# Patient Record
Sex: Male | Born: 1980 | Hispanic: Yes | Marital: Single | Smoking: Never smoker
Health system: Southern US, Community
[De-identification: ages and names within clinical notes are randomized; demographics above are authoritative.]

---

## 2016-09-03 ENCOUNTER — Emergency Department
Admission: EM | Admit: 2016-09-03 | Discharge: 2016-09-03 | Disposition: A | Discharge status: Home / Self Care | Payer: BLUE CROSS/BLUE SHIELD | Attending: Emergency Medicine | Admitting: Emergency Medicine

## 2016-09-03 ENCOUNTER — Emergency Department: Payer: BLUE CROSS/BLUE SHIELD

## 2016-09-03 ENCOUNTER — Encounter: Payer: Self-pay | Admitting: Emergency Medicine

## 2016-09-03 DIAGNOSIS — R1032 Left lower quadrant pain: Secondary | ICD-10-CM

## 2016-09-03 DIAGNOSIS — I861 Scrotal varices: Secondary | ICD-10-CM

## 2016-09-03 DIAGNOSIS — N50812 Left testicular pain: Secondary | ICD-10-CM | POA: Diagnosis present

## 2016-09-03 LAB — URINALYSIS, COMPLETE (UACMP) WITH MICROSCOPIC
BACTERIA UA: NONE SEEN
BILIRUBIN URINE: NEGATIVE
Glucose, UA: NEGATIVE mg/dL
KETONES UR: NEGATIVE mg/dL
LEUKOCYTES UA: NEGATIVE
Nitrite: NEGATIVE
Protein, ur: NEGATIVE mg/dL
SPECIFIC GRAVITY, URINE: 1.02 (ref 1.005–1.030)
pH: 6 (ref 5.0–8.0)

## 2016-09-03 NOTE — ED Provider Notes (Signed)
[facility] Emergency Department Provider Note ____________________________________________   I have reviewed the triage vital signs and the triage nursing note.  HISTORY  Chief Complaint Groin Pain   Historian Patient, through Spanish interpreter  HPI Brandon Weeks is a 36 y.o. male presents today for evaluation of left testicular pain for about 2 days now. Somewhat acute onset, but initially about 4-10 pain and over the past 2 days is now 2 out of 10 pain. He thinks there is a little bit of swelling to the testicle. No nausea or vomiting. No flank pain. No abdominal pain. No hematuria. No frequency of urination.    History reviewed. No pertinent past medical history.  There are no active problems to display for this patient.   History reviewed. No pertinent surgical history.  Prior to Admission medications   Not on File    No Known Allergies  History reviewed. No pertinent family history.  Social History Social History  Substance Use Topics  . Smoking status: Never Smoker  . Smokeless tobacco: Never Used  . Alcohol use No    Review of Systems  Constitutional: Negative for fever. Eyes: Negative for visual changes. ENT: Negative for sore throat. Cardiovascular: Negative for chest pain. Respiratory: Negative for shortness of breath. Gastrointestinal: Negative for abdominal pain, vomiting and diarrhea. Genitourinary: Negative for dysuria. Musculoskeletal: Negative for back pain. Skin: Negative for rash. Neurological: Negative for headache. 10 point Review of Systems otherwise negative ____________________________________________   PHYSICAL EXAM:  VITAL SIGNS: ED Triage Vitals  Enc Vitals Group     BP 09/03/16 0959 135/84     Pulse Rate 09/03/16 0959 72     Resp 09/03/16 0959 18     Temp 09/03/16 0959 98.5 F (36.9 C)     Temp Source 09/03/16 0959 Oral     SpO2 09/03/16 0959 100 %     Weight 09/03/16 0953 145 lb (65.8 kg)      Height --      Head Circumference --      Peak Flow --      Pain Score 09/03/16 0953 2     Pain Loc --      Pain Edu? --      Excl. in GC? --      Constitutional: Alert and oriented. Well appearing and in no distress. HEENT   Head: Normocephalic and atraumatic.      Eyes: Conjunctivae are normal. PERRL. Normal extraocular movements.      Ears:         Nose: No congestion/rhinnorhea.   Mouth/Throat: Mucous membranes are moist.   Neck: No stridor. Cardiovascular/Chest: Normal rate, regular rhythm.  No murmurs, rubs, or gallops. Respiratory: Normal respiratory effort without tachypnea nor retractions. Breath sounds are clear and equal bilaterally. No wheezes/rales/rhonchi. Gastrointestinal: Soft. No distention, no guarding, no rebound. Nontender.  Genitourinary/rectal:  No cellulitis of the scrotum. Very minimally tender slight enlargement at the anterior pole of the testicle. No swelling of the testicle itself. No hernia mass. Musculoskeletal: Nontender with normal range of motion in all extremities. No joint effusions.  No lower extremity tenderness.  No edema. Neurologic:  Normal speech and language. No gross or focal neurologic deficits are appreciated. Skin:  Skin is warm, dry and intact. No rash noted. Psychiatric: Mood and affect are normal. Speech and behavior are normal. Patient exhibits appropriate insight and judgment.   ____________________________________________  LABS (pertinent positives/negatives)  Labs Reviewed  URINALYSIS, COMPLETE (UACMP) WITH MICROSCOPIC - Abnormal; Notable for  the following:       Result Value   Color, Urine YELLOW (*)    APPearance CLEAR (*)    Hgb urine dipstick SMALL (*)    Squamous Epithelial / LPF 0-5 (*)    All other components within normal limits    ____________________________________________    EKG I, Governor Rooksebecca Shauntavia Brackin, MD, the attending physician have personally viewed and interpreted all  ECGs.  None ____________________________________________  RADIOLOGY All Xrays were viewed by me. Imaging interpreted by Radiologist.  Ultrasound scrotum with Doppler:  IMPRESSION: 1. No evidence of testicular torsion. 2. Left-sided varicocele. __________________________________________  PROCEDURES  Procedure(s) performed: None  Critical Care performed: None  ____________________________________________   ED COURSE / ASSESSMENT AND PLAN  Pertinent labs & imaging results that were available during my care of the patient were reviewed by me and considered in my medical decision making (see chart for details).    Brandon Weeks is here for evaluation of left groin pain which is improving and essentially mild at this point in time. External physical exam evaluation is overall reassuring. I have a low suspicion for testicular torsion.  Low suspicion for kidney stone, intra-abdominal emergency, inguinal hernia on physical exam. Urinalysis is overall reassuring. I will send a culture.  Ultrasound is reassuring for no sign of torsion. There is a varicocele.  Discussed anti-inflammatory symptomatic treatment and monitoring, follow-up with primary care doctor and/or urologist if there is any persistent discomfort.    CONSULTATIONS:  None Patient / Family / Caregiver informed of clinical course, medical decision-making process, and agree with plan.   I discussed return precautions, follow-up instructions, and discharge instructions with patient and/or family.  We discussed return to the emergency department for any worsening pain, bloody urine, abdominal pain, back pain, vomiting, black or bloody stools, inability to urinate, or any other symptoms concerning to him.  ___________________________________________   FINAL CLINICAL IMPRESSION(S) / ED DIAGNOSES   Final diagnoses:  Left groin pain  Varicocele              Note: This dictation was prepared with Dragon  dictation. Any transcriptional errors that result from this process are unintentional    Governor Rooksebecca Otillia Cordone, MD 09/03/16 1212

## 2016-09-03 NOTE — ED Notes (Signed)
[facility] [place] Interpretor will be needed for discharge. Urine sent.

## 2016-09-03 NOTE — ED Triage Notes (Signed)
Pt to ed with c/o left sided groin pain that  Started on Thursday. Denies injury, worse pain yesterday,

## 2018-05-31 IMAGING — US US ART/VEN ABD/PELV/SCROTUM DOPPLER LTD
1 series · 14 of 25 positions shown · non-contrast
Comparison: None.

CLINICAL DATA: Left groin pain.

EXAM:
SCROTAL ULTRASOUND
DOPPLER ULTRASOUND OF THE TESTICLES
TECHNIQUE: Complete ultrasound examination of the testicles, epididymis, and
other scrotal structures was performed. Color and spectral Doppler
ultrasound were also utilized to evaluate blood flow to the
testicles.

[Series 1: us art/ven abd/pelv/scrotum doppler ltd · 0.08mm/px · 14 of 93 slices shown]
[im 1/93]
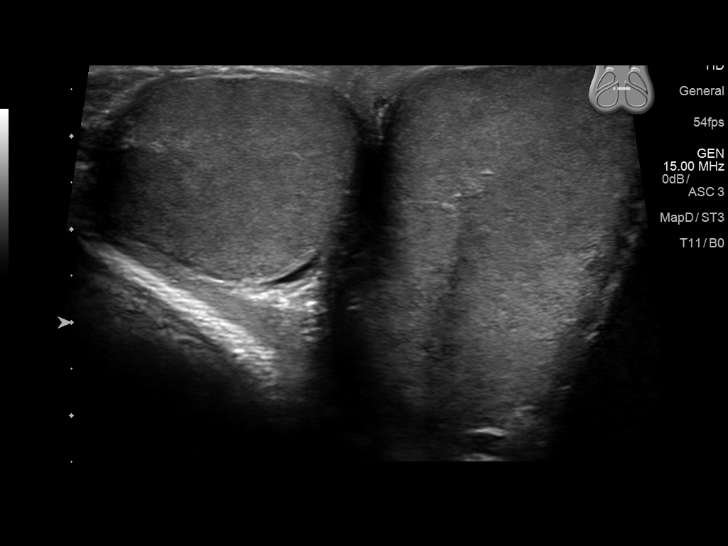
[im 8/93]
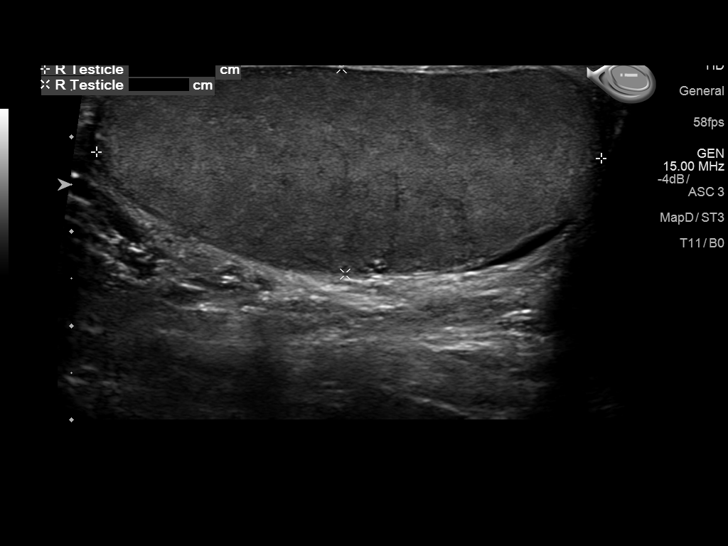
[im 16/93]
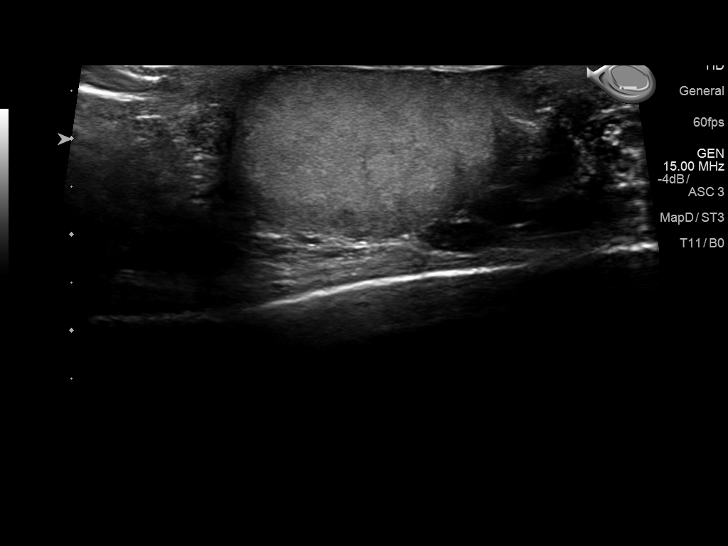
[im 24/93]
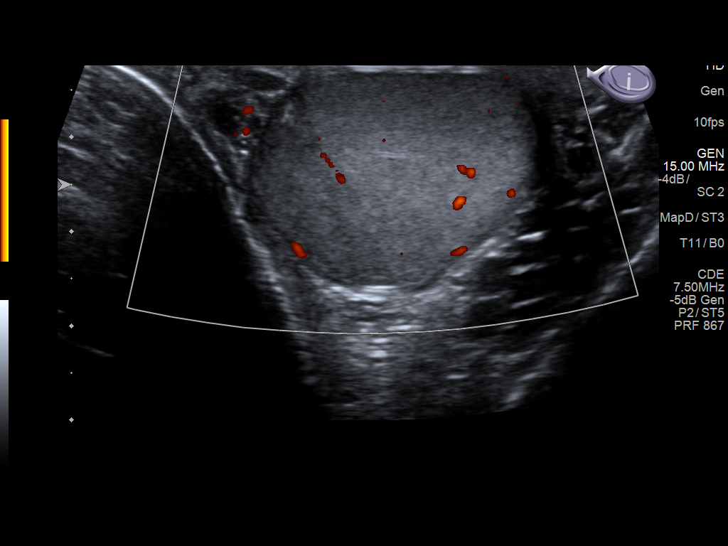
[im 31/93]
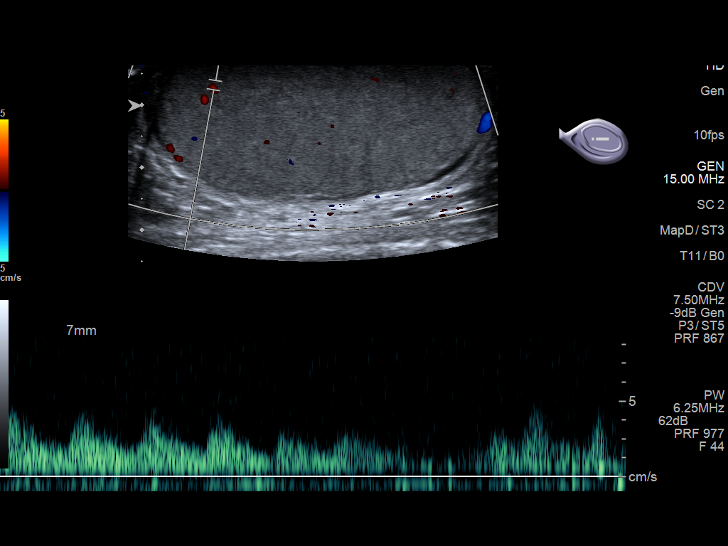
[im 35/93]
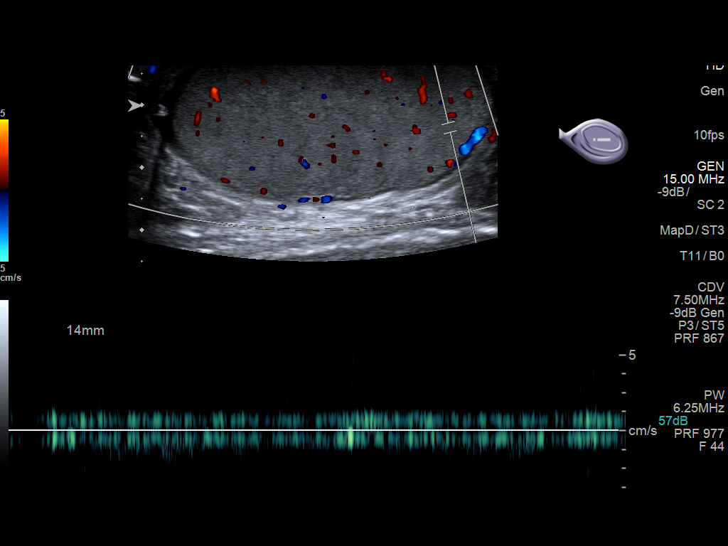
[im 43/93]
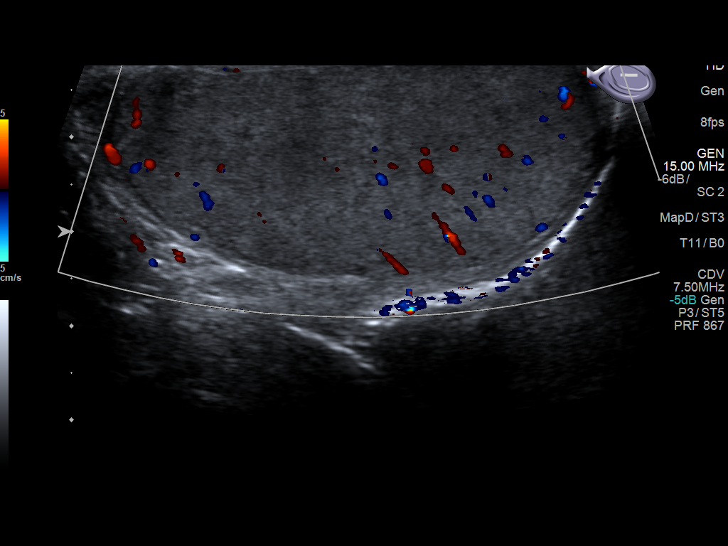
[im 50/93]
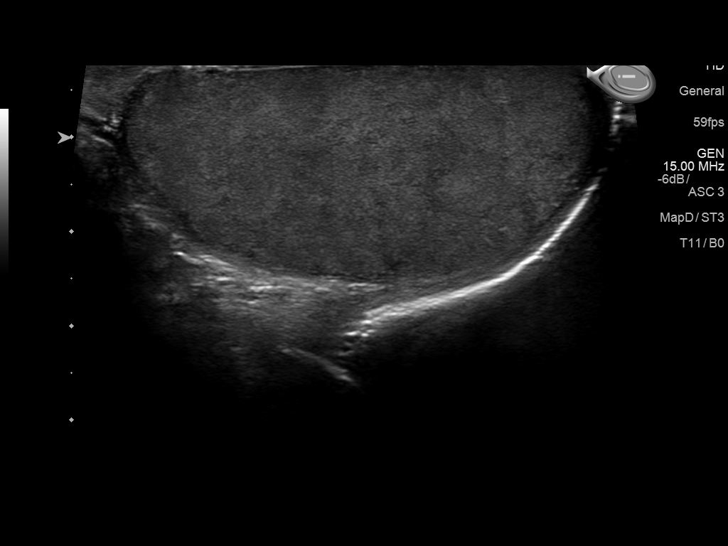
[im 58/93]
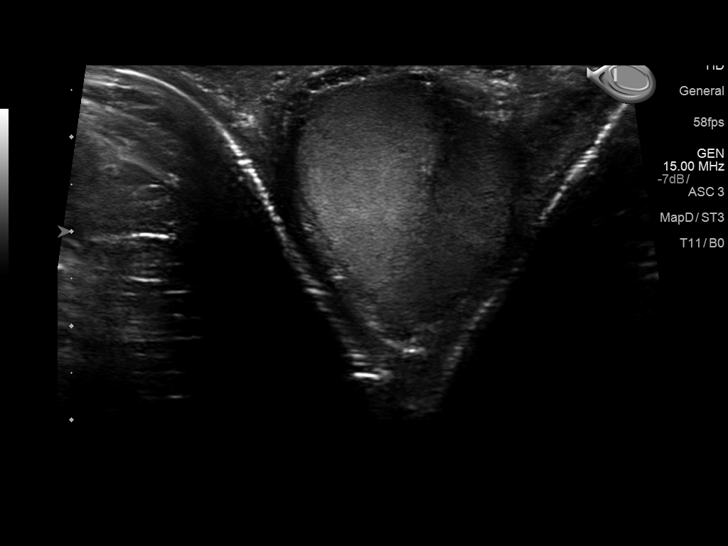
[im 62/93]
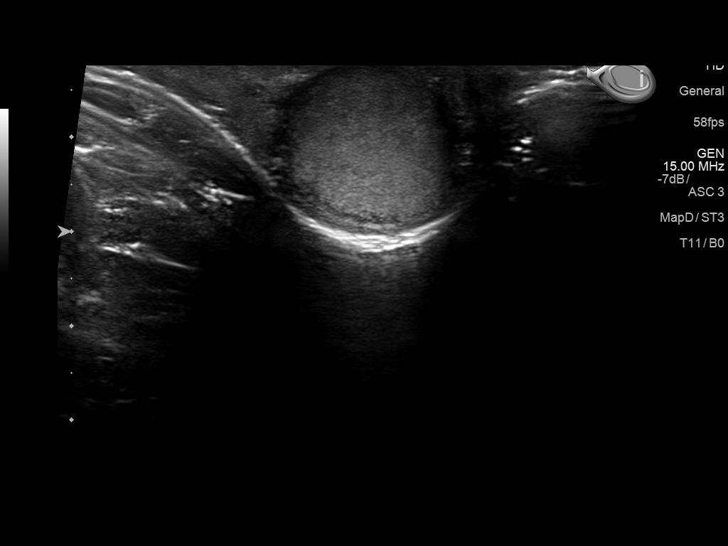
[im 70/93]
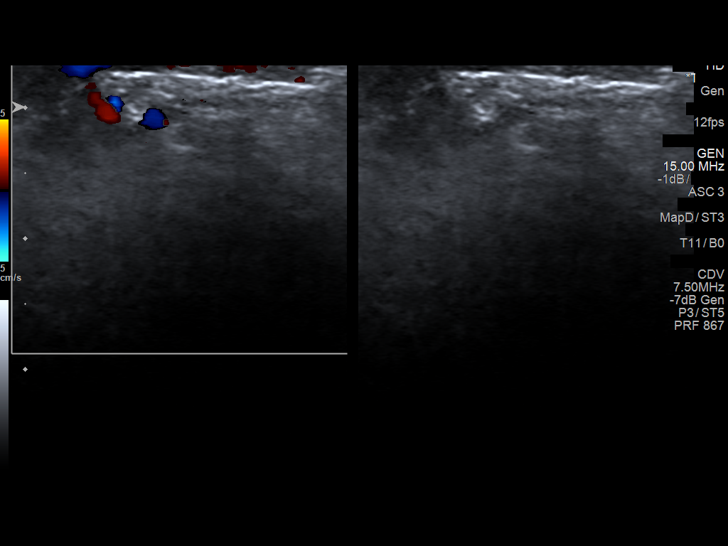
[im 77/93]
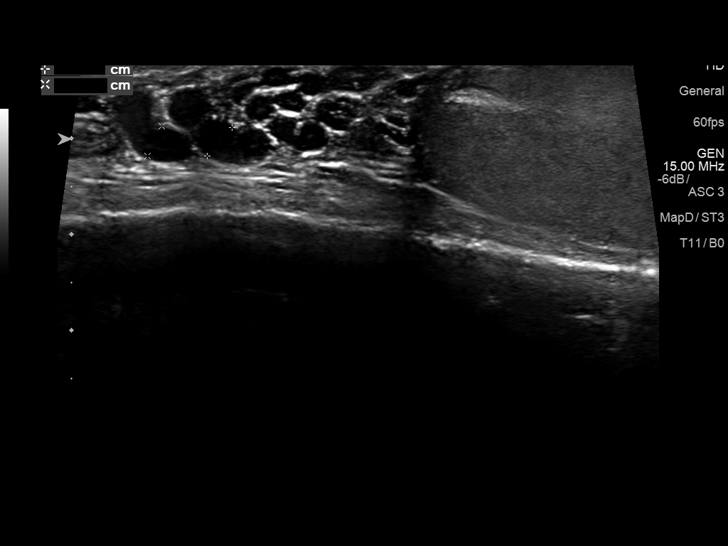
[im 85/93]
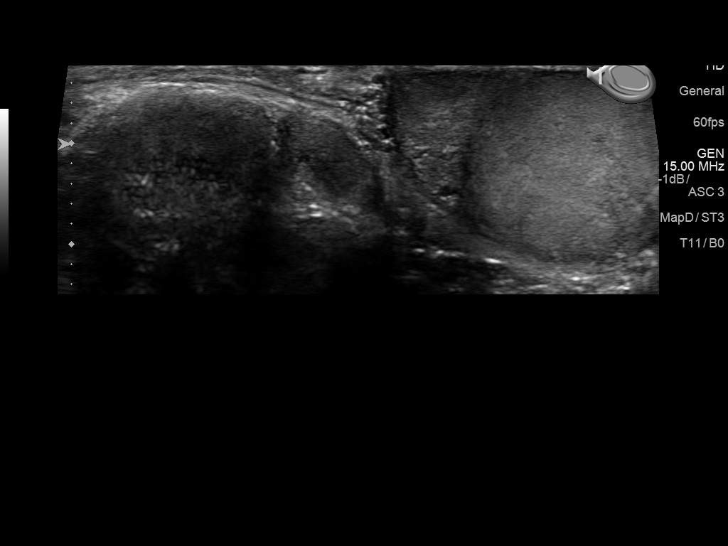
[im 93/93]
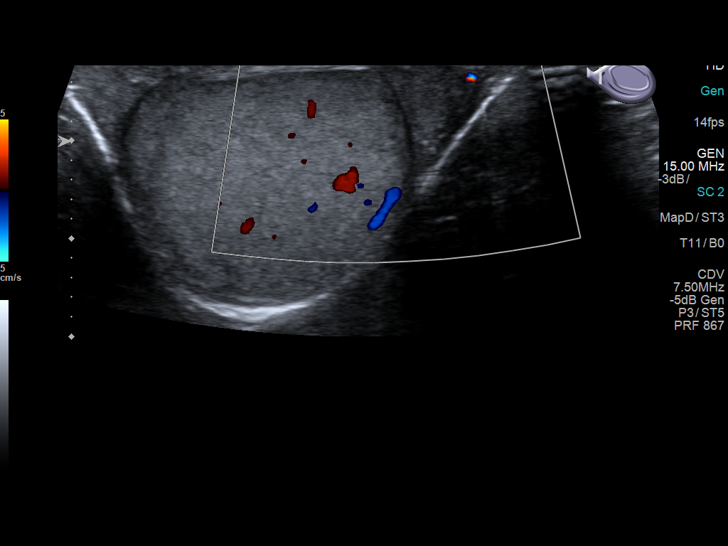

[14 of 25 positions shown; findings below may reference images not displayed]

FINDINGS: Right testicle

Measurements: 5.2 x 2.2 x 3.1 cm. No mass or microlithiasis
visualized. Color Doppler flow was identified.

Left testicle

Measurements: 5.2 x 2.3 x 3.1 cm. No mass or microlithiasis
visualized. Color Doppler flow is identified.

Right epididymis:  Subcentimeter epididymal cysts or spermatoceles.

Left epididymis:  Subcentimeter epididymal cysts or spermatoceles.

Hydrocele:  None visualized.

Varicocele:  Present on the left.

Pulsed Doppler interrogation of both testes demonstrates normal low
resistance arterial and venous waveforms bilaterally.
IMPRESSION: 1. No evidence of testicular torsion.
2. Left-sided varicocele.
